# Patient Record
Sex: Female | Born: 1935 | Race: White | Hispanic: No | Marital: Married | Smoking: Never smoker
Health system: Southern US, Community
[De-identification: ages and names within clinical notes are randomized; demographics above are authoritative.]

## PROBLEM LIST (undated history)

## (undated) DIAGNOSIS — I10 Essential (primary) hypertension: Secondary | ICD-10-CM

## (undated) HISTORY — PX: BRAIN SURGERY: SHX531

## (undated) HISTORY — DX: Essential (primary) hypertension: I10

---

## 2007-08-22 ENCOUNTER — Emergency Department: Payer: Self-pay | Admitting: Emergency Medicine

## 2008-03-14 ENCOUNTER — Ambulatory Visit: Payer: Self-pay | Admitting: Otolaryngology

## 2010-08-15 ENCOUNTER — Ambulatory Visit: Payer: Self-pay | Admitting: Internal Medicine

## 2013-11-18 ENCOUNTER — Ambulatory Visit: Payer: Self-pay | Admitting: Family Medicine

## 2017-01-15 NOTE — Progress Notes (Deleted)
01/16/2017 10:07 PM   Whitney Riddle 1932-07-20 [id number]  Referring provider: [name], [place], [place] [address]  No chief complaint on file.   HPI: Patient is a 83 year old *** female who is referred to [place] by, ***, for recurrent urinary tract infections.  Patient states that she has had *** urinary tract infections over the last year.  Reviewing her records,  she has had *** .    Her symptoms with a urinary tract infection consist of ***.  She denies/endorses dysuria, gross hematuria, suprapubic pain, back pain, abdominal pain or flank pain.***  She denies/endorses dysuria, gross hematuria, suprapubic pain, back pain, abdominal pain or flank pain.***  She has not had any recent fevers, chills, nausea or vomiting. ***  She has not had any recent fevers, chills, nausea or vomiting. ***  She does/does not have a history of nephrolithiasis, GU surgery or GU trauma. ***  She does/does not have a history of nephrolithiasis, GU surgery or GU trauma. ***  She is/is not sexually active.  She has/has not noted a correlation with her urinary tract infections and sexual intercourse.  ***   She does/does not engage in anal sex. ***  She is/ is not having anal to vaginal sex.*** She is/is not voiding before and after sex. ***     She is/is not postmenopausal. ***  She admits to/denies constipation and/or diarrhea. ***  She does/does not use tampons.  She does/does not engage in good perineal hygiene. She does/does not take tub baths. ***  She has/does not have incontinence.  She is using incontinence pads. ***  She is having/ not having pain with bladder filling.  ***  She has/not had any recent imaging studies.  ***  She is drinking *** of water daily.     Reviewed referral notes.    PMH: No past medical history on file.  Surgical History: No past surgical history on file.  Home Medications:  Allergies as of 01/16/2017   Not on  File     Medication List    as of 01/15/2017 10:07 PM   You have not been prescribed any medications.     Allergies: Allergies not on file  Family History: No family history on file.  Social History:  has no tobacco, alcohol, and drug history on file.  ROS:                                        Physical Exam: There were no vitals taken for this visit.  Constitutional: Well nourished. Alert and oriented, No acute distress. HEENT: [place] AT, moist mucus membranes. Trachea midline, no masses. Cardiovascular: No clubbing, cyanosis, or edema. Respiratory: Normal respiratory effort, no increased work of breathing. GI: Abdomen is soft, non tender, non distended, no abdominal masses. Liver and spleen not palpable.  No hernias appreciated.  Stool sample for occult testing is not indicated.   GU: No CVA tenderness.  No bladder fullness or masses.  Normal external genitalia, normal pubic hair distribution, no lesions.  Normal urethral meatus, no lesions, no prolapse, no discharge.   No urethral masses, tenderness and/or tenderness. No bladder fullness, tenderness or masses. Normal vagina mucosa, good estrogen effect, no discharge, no lesions, good pelvic support, no cystocele or rectocele noted.  No cervical motion tenderness.  Uterus is freely mobile and non-fixed.  No adnexal/parametria masses or tenderness  noted.  Anus and perineum are without rashes or lesions.   *** Skin: No rashes, bruises or suspicious lesions. Lymph: No cervical or inguinal adenopathy. Neurologic: Grossly intact, no focal deficits, moving all 4 extremities. Psychiatric: Normal mood and affect.  Laboratory Data:  Urinalysis ***  I have reviewed the labs.   Pertinent Imaging: *** I have independently reviewed the films.    Assessment & Plan:  ***   - criteria for recurrent UTI has been met with 2 or more infections in 6 months or 3 or greater infections in one year ***  - Patient is  instructed to increase their water intake until the urine is pale yellow or clear (10 to 12 cups daily) ***  - probiotics (yogurt, oral pills or vaginal suppositories), take cranberry pills or drink the juice and Vitamin C 1,000 mg daily to acidify the urine should be added to their daily regimen ***  -. if using tampons, she should remove them prior to urinating and change them often ***  -avoid soaking in tubs and wipe front to back after urinating ***  - benefit from core strengthening exercises has been seen.  We can refer her to PT if they desire ***  - advised them to have CATH UA's for urinalysis and culture to prevent skin contamination of the specimen  - reviewed symptoms of UTI and advised not to have urine checked or be treated for UTI if not experiencing symptoms  - discussed antibiotic stewardship with the patient                                                   No Follow-up on file.  These notes generated with voice recognition software. I apologize for typographical errors.  Zara Council, [facility] [address] [place], [place] [address] [phone]

## 2017-01-16 ENCOUNTER — Other Ambulatory Visit: Admission: RE | Admit: 2017-01-16 | Payer: Self-pay | Source: Ambulatory Visit

## 2017-01-16 ENCOUNTER — Ambulatory Visit (INDEPENDENT_AMBULATORY_CARE_PROVIDER_SITE_OTHER): Payer: Medicare Other | Admitting: Urology

## 2017-01-16 ENCOUNTER — Encounter: Payer: Self-pay | Admitting: Urology

## 2017-01-16 ENCOUNTER — Ambulatory Visit: Payer: Self-pay | Admitting: Urology

## 2017-01-16 ENCOUNTER — Other Ambulatory Visit
Admission: RE | Admit: 2017-01-16 | Discharge: 2017-01-16 | Disposition: A | Discharge status: Home / Self Care | Payer: Medicare Other | Source: Ambulatory Visit | Attending: Urology | Admitting: Urology

## 2017-01-16 VITALS — BP 136/67 | HR 70 | Ht 63.5 in | Wt 154.5 lb

## 2017-01-16 DIAGNOSIS — N390 Urinary tract infection, site not specified: Secondary | ICD-10-CM

## 2017-01-16 DIAGNOSIS — N952 Postmenopausal atrophic vaginitis: Secondary | ICD-10-CM | POA: Diagnosis not present

## 2017-01-16 LAB — BLADDER SCAN AMB NON-IMAGING: SCAN RESULT: 0

## 2017-01-16 NOTE — Progress Notes (Signed)
01/16/2017 10:19 AM   Whitney Riddle [birth year] [id number]  Referring provider: No referring provider defined for this encounter.  Chief Complaint  Patient presents with  . New Patient (Initial Visit)    Recurrent Uti referred by Dr. Kym Riddle    HPI: Patient is a 83 year old Caucasian female who is referred to [place] by, Dr. Valera Riddle , for recurrent urinary tract infections with her daughter, Whitney Riddle.    Patient states that she has had three urinary tract infections over the last year, but the last two months she has had back to back infections.    Reviewing her records,  she has had three documented positive cultures over the last year.  Her symptoms with a urinary tract infection consist of dysuria.  She denies dysuria, gross hematuria, suprapubic pain, back pain, abdominal pain or flank pain.  She has not had any recent fevers, chills, nausea or vomiting.   She does not have a history of nephrolithiasis, GU surgery or GU trauma.   She is not sexually active.  She is postmenopausal.   She /denies constipation and/or diarrhea. She does engage in good perineal hygiene. She does not take tub baths.   She is not having pain with bladder filling.  She has not had any recent imaging studies.  She is currently experiencing frequency, urgency and nocturia x 1.  PVR is 0 mL.    She is drinking a lot of water daily.  Just started coffee again.  Drinks cranberry juice everyday.  Takes probiotics.      Reviewed referral notes.       PMH: Past Medical History:  Diagnosis Date  . HTN (hypertension)     Surgical History: Past Surgical History:  Procedure Laterality Date  . BRAIN SURGERY     tumor     years ago    Home Medications:  Allergies as of 01/16/2017   Not on File     Medication List       Accurate as of 01/16/17 10:19 AM. Always use your most recent med list.          aspirin 81 MG tablet Take by mouth.   hydrochlorothiazide 25 MG tablet Commonly known  as:  HYDRODIURIL Take by mouth.   lisinopril 5 MG tablet Commonly known as:  PRINIVIL,ZESTRIL Take by mouth.   PROBIOTIC PO Take by mouth.   simvastatin 20 MG tablet Commonly known as:  ZOCOR Take by mouth.   valACYclovir 1000 MG tablet Commonly known as:  VALTREX Take by mouth.   Vitamin D3 1000 units Caps Take by mouth.       Allergies: Not on File  Family History: Family History  Problem Relation Age of Onset  . Kidney cancer Neg Hx   . Bladder Cancer Neg Hx     Social History:  reports that she has never smoked. She has never used smokeless tobacco. She reports that she does not drink alcohol or use drugs.  ROS: UROLOGY Frequent Urination?: Yes Hard to postpone urination?: Yes Burning/pain with urination?: No Get up at night to urinate?: Yes Leakage of urine?: No Urine stream starts and stops?: No Trouble starting stream?: No Do you have to strain to urinate?: No Blood in urine?: No Urinary tract infection?: Yes Sexually transmitted disease?: No Injury to kidneys or bladder?: No Painful intercourse?: No Weak stream?: No Currently pregnant?: No Vaginal bleeding?: No Last menstrual period?: n  Gastrointestinal Nausea?: No Vomiting?: No Indigestion/heartburn?: No Diarrhea?: No Constipation?:  No  Constitutional Fever: No Night sweats?: No Weight loss?: No Fatigue?: No  Skin Skin rash/lesions?: Yes Itching?: No  Eyes Blurred vision?: No Double vision?: No  Ears/Nose/Throat Sore throat?: No Sinus problems?: No  Hematologic/Lymphatic Swollen glands?: No Easy bruising?: No  Cardiovascular Leg swelling?: No Chest pain?: No  Respiratory Cough?: No Shortness of breath?: No  Endocrine Excessive thirst?: No  Musculoskeletal Back pain?: No Joint pain?: No  Neurological Headaches?: No Dizziness?: No  Psychologic Depression?: No Anxiety?: No  Physical Exam: BP 136/67   Pulse 70   Ht 5' 3.5" (1.613 m)   Wt 154 lb 8 oz  (70.1 kg)   BMI 26.94 kg/m   Constitutional: Well nourished. Alert and oriented, No acute distress. HEENT: [place] AT, moist mucus membranes. Trachea midline, no masses. Cardiovascular: No clubbing, cyanosis, or edema. Respiratory: Normal respiratory effort, no increased work of breathing. GI: Abdomen is soft, non tender, non distended, no abdominal masses. Liver and spleen not palpable.  No hernias appreciated.  Stool sample for occult testing is not indicated.   GU: No CVA tenderness.  No bladder fullness or masses.  Atrophic external genitalia, normal pubic hair distribution, no lesions.  Normal urethral meatus, no lesions, no prolapse, no discharge.   No urethral masses, tenderness and/or tenderness. No bladder fullness, tenderness or masses. Pale vagina mucosa, poor estrogen effect, no discharge, no lesions, good pelvic support, no cystocele or rectocele noted.  No cervical motion tenderness.  Uterus is freely mobile and non-fixed.  No adnexal/parametria masses or tenderness noted.  Anus and perineum are without rashes or lesions.    Skin: No rashes, bruises or suspicious lesions. Lymph: No cervical or inguinal adenopathy. Neurologic: Grossly intact, no focal deficits, moving all 4 extremities. Psychiatric: Normal mood and affect.  Laboratory Data: Serum creatinine 0.8 on 10/07/2016  Pertinent Imaging: Results for Whitney, Riddle (MRN [id number]) as of 01/16/2017 09:51  Ref. Range 01/16/2017 09:49  Scan Result Unknown 0   I have independently reviewed the films.    Assessment & Plan:    1. Recurrent UTI's  - criteria for recurrent UTI has been met with 2 or more infections in 6 months or 3 or greater infections in one year   - Patient is instructed to increase their water intake until the urine is pale yellow or clear (10 to 12 cups daily)   - probiotics (yogurt, oral pills or vaginal suppositories), take cranberry pills or drink the juice and Vitamin C 1,000 mg daily to acidify the urine  should be added to their daily regimen   - avoid soaking in tubs and wipe front to back after urinating   - advised them to have CATH UA's for urinalysis and culture to prevent skin contamination of the specimen  - reviewed symptoms of UTI and advised not to have urine checked or be treated for UTI if not experiencing symptoms  - discussed antibiotic stewardship with the patient    - BLADDER SCAN AMB NON-IMAGING  - obtain a RUS to rule out nidus for infection  2. Vaginal atrophy  - discussed vaginal estrogen cream use - patient has sisters with breast cancer and does not want to start the cream  - suggested coconut oil for discomfort  Return for I will call patient with results.  These notes generated with voice recognition software. I apologize for typographical errors.  Zara Council, [facility] [address] [place], [place] [address] [phone] 01/16/2017 10:07 PM

## 2017-01-28 ENCOUNTER — Ambulatory Visit
Admission: RE | Admit: 2017-01-28 | Discharge: 2017-01-28 | Disposition: A | Discharge status: Home / Self Care | Payer: Medicare Other | Source: Ambulatory Visit | Attending: Urology | Admitting: Urology

## 2017-01-28 DIAGNOSIS — Z87440 Personal history of urinary (tract) infections: Secondary | ICD-10-CM | POA: Diagnosis not present

## 2017-01-28 DIAGNOSIS — Z09 Encounter for follow-up examination after completed treatment for conditions other than malignant neoplasm: Secondary | ICD-10-CM | POA: Diagnosis not present

## 2017-01-28 DIAGNOSIS — N390 Urinary tract infection, site not specified: Secondary | ICD-10-CM | POA: Diagnosis present

## 2017-01-30 ENCOUNTER — Telehealth: Payer: Self-pay | Admitting: Family Medicine

## 2017-01-30 ENCOUNTER — Ambulatory Visit: Payer: Medicare Other

## 2017-01-30 NOTE — Telephone Encounter (Signed)
-----   Message from Shannon A McGowan, PA-C sent at 01/28/2017  2:26 PM EDT ----- Please let Mrs. Austill know that her RUS was normal. 

## 2017-01-30 NOTE — Telephone Encounter (Signed)
-----   Message from Harle BattiestShannon A McGowan, PA-C sent at 01/28/2017  2:26 PM EDT ----- Please let Mrs. Whitney Riddle know that her RUS was normal.

## 2017-01-30 NOTE — Telephone Encounter (Signed)
Left message for patient to return call.

## 2017-02-03 NOTE — Telephone Encounter (Signed)
Pt's daughter, Izetta DakinSara Briggs, called and I read message that RUS was fine.  She said mom was still experiencing the same symptoms.  Should she follow up with her MD?

## 2017-02-05 NOTE — Telephone Encounter (Signed)
Spoke with pt daughter, Huntley DecSara, in reference to pt continuing to have dysuria. Huntley DecSara stated she is not sure if pt is cocoanut oil or not. Reinforced with Huntley DecSara pt should use cocoanut oil or consider estrogen creams. Huntley DecSara voiced understanding stating she will f/u with pt.

## 2019-09-12 ENCOUNTER — Other Ambulatory Visit: Payer: Self-pay

## 2019-09-12 ENCOUNTER — Emergency Department: Payer: Medicare Other

## 2019-09-12 ENCOUNTER — Emergency Department
Admission: EM | Admit: 2019-09-12 | Discharge: 2019-09-13 | Disposition: A | Discharge status: Home / Self Care | Payer: Medicare Other | Attending: Student in an Organized Health Care Education/Training Program | Admitting: Student in an Organized Health Care Education/Training Program

## 2019-09-12 ENCOUNTER — Encounter: Payer: Self-pay | Admitting: Emergency Medicine

## 2019-09-12 DIAGNOSIS — M25561 Pain in right knee: Secondary | ICD-10-CM | POA: Diagnosis present

## 2019-09-12 DIAGNOSIS — M7121 Synovial cyst of popliteal space [Baker], right knee: Secondary | ICD-10-CM | POA: Diagnosis not present

## 2019-09-12 DIAGNOSIS — I10 Essential (primary) hypertension: Secondary | ICD-10-CM | POA: Diagnosis not present

## 2019-09-12 DIAGNOSIS — Z79899 Other long term (current) drug therapy: Secondary | ICD-10-CM | POA: Insufficient documentation

## 2019-09-12 DIAGNOSIS — M11261 Other chondrocalcinosis, right knee: Secondary | ICD-10-CM | POA: Diagnosis not present

## 2019-09-12 DIAGNOSIS — Z7982 Long term (current) use of aspirin: Secondary | ICD-10-CM | POA: Insufficient documentation

## 2019-09-12 DIAGNOSIS — M1711 Unilateral primary osteoarthritis, right knee: Secondary | ICD-10-CM | POA: Diagnosis not present

## 2019-09-12 MED ORDER — KETOROLAC TROMETHAMINE 30 MG/ML IJ SOLN
30.0000 mg | Freq: Once | INTRAMUSCULAR | Status: AC
  Administered 2019-09-12: 23:00:00 30 mg via INTRAMUSCULAR
  Filled 2019-09-12: qty 1

## 2019-09-12 MED ORDER — PREDNISONE 10 MG PO TABS: 10.0000 mg | ORAL_TABLET | Freq: Every day | ORAL | 0 refills | Status: DC

## 2019-09-12 MED ORDER — TRAMADOL HCL 50 MG PO TABS: 25.0000 mg | ORAL_TABLET | Freq: Four times a day (QID) | ORAL | 0 refills | Status: AC | PRN

## 2019-09-12 MED ORDER — TRAMADOL HCL 50 MG PO TABS
50.0000 mg | ORAL_TABLET | Freq: Once | ORAL | Status: AC
  Administered 2019-09-12: 23:00:00 50 mg via ORAL
  Filled 2019-09-12: qty 1

## 2019-09-12 NOTE — Discharge Instructions (Addendum)
Please rest ice and elevate the right knee.  Use Ace wrap and knee brace as needed for comfort.  Recommend using walker for ambulation until you can ambulate without a limp.  Take prednisone as prescribed and tramadol as needed for more severe pain.  Call orthopedic office tomorrow to schedule follow-up appointment.  Return to the ER for any increasing pain, swelling, worsening symptoms or urgent changes in your health.

## 2019-09-12 NOTE — ED Provider Notes (Signed)
[facility] EMERGENCY DEPARTMENT Provider Note   CSN: [id number] Arrival date & time: 09/12/19  2052     History Chief Complaint  Patient presents with   Knee Pain    Whitney Riddle is a 86 y.o. female presents to the emergency department for evaluation of right knee pain.  Several days ago her knee buckled gave way and she was having a little bit of knee discomfort.  Earlier today her knee gave way and she fell complaining of increasing knee pain.  No dizziness or lightheadedness, chest pain or shortness of breath.  She has some swelling throughout the knee mostly complains of swelling behind the knee along the popliteal region.  No calf pain or pain or swelling throughout the calf.  No numbness or tingling.  No groin or thigh pain.  She denies any other injury to her body.  She has not had any medications for pain.  HPI     Past Medical History:  Diagnosis Date   HTN (hypertension)     There are no problems to display for this patient.   Past Surgical History:  Procedure Laterality Date   BRAIN SURGERY     tumor     years ago     OB History   No obstetric history on file.     Family History  Problem Relation Age of Onset   Kidney cancer Neg Hx    Bladder Cancer Neg Hx     Social History   Tobacco Use   Smoking status: Never Smoker   Smokeless tobacco: Never Used  Substance Use Topics   Alcohol use: No   Drug use: No    Home Medications Prior to Admission medications   Medication Sig Start Date End Date Taking? Authorizing Provider  aspirin 81 MG tablet Take by mouth. 10/01/10   [provider]  Cholecalciferol (VITAMIN D3) 1000 units CAPS Take by mouth.    [provider]  hydrochlorothiazide (HYDRODIURIL) 25 MG tablet Take by mouth. 10/07/16 09/17/17  [provider]  lisinopril (PRINIVIL,ZESTRIL) 5 MG tablet Take by mouth. 10/07/16 09/17/17  [provider]  predniSONE (DELTASONE) 10 MG tablet  Take 1 tablet (10 mg total) by mouth daily. 6,5,4,3,2,1 six day taper 09/12/19   Evon Slack, PA-C  Probiotic Product (PROBIOTIC PO) Take by mouth.    [provider]  simvastatin (ZOCOR) 20 MG tablet Take by mouth. 10/07/16 09/17/17  [provider]  traMADol (ULTRAM) 50 MG tablet Take 0.5-1 tablets (25-50 mg total) by mouth every 6 (six) hours as needed. 09/12/19 09/11/20  Evon Slack, PA-C  valACYclovir (VALTREX) 1000 MG tablet Take by mouth. 10/07/16   [provider]    Allergies    Patient has no known allergies.  Review of Systems   Review of Systems  Respiratory: Negative for shortness of breath.   Gastrointestinal: Negative for nausea and vomiting.  Musculoskeletal: Positive for arthralgias, gait problem and joint swelling. Negative for myalgias and neck pain.  Skin: Negative for rash and wound.  Neurological: Negative for dizziness, numbness and headaches.    Physical Exam Updated Vital Signs BP (!) 172/78 (BP Location: Right Arm)    Pulse 61    Temp 98.4 F (36.9 C) (Oral)    Resp 20    Ht 5\' 3"  (1.6 m)    Wt 70.3 kg    SpO2 100%    BMI 27.46 kg/m   Physical Exam Constitutional:  Appearance: She is well-developed.  HENT:     Head: Normocephalic and atraumatic.  Eyes:     Conjunctiva/sclera: Conjunctivae normal.  Cardiovascular:     Rate and Rhythm: Normal rate.  Pulmonary:     Effort: Pulmonary effort is normal. No respiratory distress.  Musculoskeletal:     Cervical back: Normal range of motion.     Comments: Examination of the right lower extremity shows negative logrolling test.  She has pain with flexion past 90 degrees reproducing pain along the Baker's cyst.  She has a palpable fluctuant Baker's cyst.  No warmth or erythema throughout the knee.  No noticeable knee effusion.  Extensor mechanism is intact, able to actively straight leg raise.  Patella tracks well.  No calf tenderness.  Ankle plantarflexion dorsiflexion is intact.   No warmth redness or edema throughout the lower extremity.  No ligamentous laxity with valgus or varus stress testing.  She does have some tenderness along the medial and lateral joint line of the right knee.  Skin:    General: Skin is warm.     Findings: No rash.  Neurological:     Mental Status: She is alert and oriented to person, place, and time.  Psychiatric:        Behavior: Behavior normal.        Thought Content: Thought content normal.     ED Results / Procedures / Treatments   Labs (all labs ordered are listed, but only abnormal results are displayed) Labs Reviewed - No data to display  EKG None  Radiology DG Knee Complete 4 Views Right  Result Date: 09/12/2019 CLINICAL DATA:  Right knee pain and swelling, fell EXAM: RIGHT KNEE - COMPLETE 4+ VIEW COMPARISON:  None. FINDINGS: Frontal, bilateral oblique, and lateral views of the right knee are obtained. There is mild 3 compartmental osteoarthritis with joint space narrowing and osteophyte formation most pronounced in the medial and patellofemoral compartments. There is diffuse chondrocalcinosis. No fracture, subluxation, or dislocation. No joint effusion. IMPRESSION: 1. Three compartmental osteoarthritis. 2. No acute bony abnormality. Electronically Signed   By: Sharlet Salina M.D.   On: 09/12/2019 22:13    Procedures .Splint Application  Date/Time: 09/12/2019 11:15 PM Performed by: Evon Slack, PA-C Authorized by: Evon Slack, PA-C   Consent:    Consent obtained:  Verbal   Consent given by:  Patient Pre-procedure details:    Sensation:  Normal Procedure details:    Laterality:  Right   Location:  Knee   Knee:  R knee   Strapping: no     Supplies:  Prefabricated splint (Short Velcro knee stabilizing brace.  Ace wrap applied underneath for compression) Post-procedure details:    Pain:  Improved   Sensation:  Normal   Patient tolerance of procedure:  Tolerated well, no immediate complications    (including critical care time)  Medications Ordered in ED Medications  ketorolac (TORADOL) 30 MG/ML injection 30 mg (30 mg Intramuscular Given 09/12/19 2248)  traMADol (ULTRAM) tablet 50 mg (50 mg Oral Given 09/12/19 2247)    ED Course  I have reviewed the triage vital signs and the nursing notes.  Pertinent labs & imaging results that were available during my care of the patient were reviewed by me and considered in my medical decision making (see chart for details).    MDM Rules/Calculators/A&P                      86 year old female with right knee  buckling and giving away on her.  X-ray show mild tricompartmental osteoarthritis with chondrocalcinosis.  On exam she is noted to have a Baker's cyst.  No tendon deficits or ligamentous laxity.  No sign of blood clot or hip fracture.  She is quite tender along the Baker's cyst.  She is educated on rest ice and elevation as well as compression.  She is placed into Ace wrap as well as the knee brace.  She will use a walker for ambulation. Final Clinical Impression(s) / ED Diagnoses Final diagnoses:  Primary osteoarthritis of right knee  Synovial cyst of right popliteal space  Chondrocalcinosis of right knee  Acute pain of right knee    Rx / DC Orders ED Discharge Orders         Ordered    traMADol (ULTRAM) 50 MG tablet  Every 6 hours PRN     09/12/19 2311    predniSONE (DELTASONE) 10 MG tablet  Daily     09/12/19 2311           Renata Caprice 09/12/19 2323    Merlyn Lot, MD 09/12/19 2325

## 2019-09-12 NOTE — ED Notes (Signed)
Pt c/o mechanical fall earlier today. Pt c/o R knee pain, worse with movement. Pt with noted swelling to R knee upon assessment. Pt is currently straighten R knee at this time.

## 2019-09-12 NOTE — ED Triage Notes (Signed)
Pt to triage via w/c, mask in place with no distress noted; pt reports PTA slipped and fell on rt knee; c/o persistent pain

## 2021-06-13 ENCOUNTER — Encounter: Payer: Self-pay | Admitting: Unknown Physician Specialty

## 2021-06-14 NOTE — Discharge Instructions (Signed)
[facility] DISCHARGE INSTRUCTIONS FOR MYRINGOTOMY AND TUBE INSERTION  [facility] Davina Poke, M.D.   Diet:   After surgery, the patient should take only liquids and foods as tolerated.  The patient may then have a regular diet after the effects of anesthesia have worn off, usually about four to six hours after surgery.  Activities:   The patient should rest until the effects of anesthesia have worn off.  After this, there are no restrictions on the normal daily activities.  Medications:   You will be given antibiotic drops to be used in the ears postoperatively.  It is recommended to use 4 drops 2 times a day for 4 days, then the drops should be saved for possible future use.  The tubes should not cause any discomfort to the patient, but if there is any question, Tylenol should be given according to the instructions for the age of the patient.  Other medications should be continued normally.  Precautions:   Should there be recurrent drainage after the tubes are placed, the drops should be used for approximately 3-4 days.  If it does not clear, you should call the ENT office.  Earplugs:   Earplugs are only needed for those who are going to be submerged under water.  When taking a bath or shower and using a cup or showerhead to rinse hair, it is not necessary to wear earplugs.  These come in a variety of fashions, all of which can be obtained at our office.  However, if one is not able to come by the office, then silicone plugs can be found at most pharmacies.  It is not advised to stick anything in the ear that is not approved as an earplug.  Silly putty is not to be used as an earplug.  Swimming is allowed in patients after ear tubes are inserted, however, they must wear earplugs if they are going to be submerged under water.  For those children who are going to be swimming a lot, it is recommended to use a fitted ear mold, which can be made by our  audiologist.  If discharge is noticed from the ears, this most likely represents an ear infection.  We would recommend getting your eardrops and using them as indicated above.  If it does not clear, then you should call the ENT office.  For follow up, the patient should return to the ENT office three weeks postoperatively and then every six months as required by the doctor. [facility] DISCHARGE INSTRUCTIONS FOR MYRINGOTOMY AND TUBE INSERTION  [facility] Davina Poke, M.D.   Diet:   After surgery, the patient should take only liquids and foods as tolerated.  The patient may then have a regular diet after the effects of anesthesia have worn off, usually about four to six hours after surgery.  Activities:   The patient should rest until the effects of anesthesia have worn off.  After this, there are no restrictions on the normal daily activities.  Medications:   You will be given antibiotic drops to be used in the ears postoperatively.  It is recommended to use 4 drops 2 times a day for 4 days, then the drops should be saved for possible future use.  The tubes should not cause any discomfort to the patient, but if there is any question, Tylenol should be given according to the instructions for the age of the patient.  Other  medications should be continued normally.  Precautions:   Should there be recurrent drainage after the tubes are placed, the drops should be used for approximately 3-4 days.  If it does not clear, you should call the ENT office.  Earplugs:   Earplugs are only needed for those who are going to be submerged under water.  When taking a bath or shower and using a cup or showerhead to rinse hair, it is not necessary to wear earplugs.  These come in a variety of fashions, all of which can be obtained at our office.  However, if one is not able to come by the office, then silicone plugs can be found at most pharmacies.  It is not advised to stick  anything in the ear that is not approved as an earplug.  Silly putty is not to be used as an earplug.  Swimming is allowed in patients after ear tubes are inserted, however, they must wear earplugs if they are going to be submerged under water.  For those children who are going to be swimming a lot, it is recommended to use a fitted ear mold, which can be made by our audiologist.  If discharge is noticed from the ears, this most likely represents an ear infection.  We would recommend getting your eardrops and using them as indicated above.  If it does not clear, then you should call the ENT office.  For follow up, the patient should return to the ENT office three weeks postoperatively and then every six months as required by the doctor.

## 2021-06-21 ENCOUNTER — Ambulatory Visit: Payer: Medicare Other | Admitting: Anesthesiology

## 2021-06-21 ENCOUNTER — Encounter: Payer: Self-pay | Admitting: Unknown Physician Specialty

## 2021-06-21 ENCOUNTER — Encounter
Admission: RE | Disposition: A | Discharge status: Home / Self Care | Payer: Self-pay | Source: Home / Self Care | Attending: Unknown Physician Specialty

## 2021-06-21 ENCOUNTER — Other Ambulatory Visit: Payer: Self-pay

## 2021-06-21 ENCOUNTER — Ambulatory Visit
Admission: RE | Admit: 2021-06-21 | Discharge: 2021-06-21 | Disposition: A | Discharge status: Home / Self Care | Payer: Medicare Other | Attending: Unknown Physician Specialty | Admitting: Unknown Physician Specialty

## 2021-06-21 DIAGNOSIS — H6532 Chronic mucoid otitis media, left ear: Secondary | ICD-10-CM | POA: Insufficient documentation

## 2021-06-21 DIAGNOSIS — H6992 Unspecified Eustachian tube disorder, left ear: Secondary | ICD-10-CM | POA: Diagnosis present

## 2021-06-21 HISTORY — PX: MYRINGOTOMY WITH TUBE PLACEMENT: SHX5663

## 2021-06-21 SURGERY — MYRINGOTOMY WITH TUBE PLACEMENT
Anesthesia: General | Site: Ear | Laterality: Left

## 2021-06-21 MED ORDER — GLYCOPYRROLATE 0.2 MG/ML IJ SOLN
INTRAMUSCULAR | Status: DC | PRN
  Administered 2021-06-21: .1 mg via INTRAVENOUS

## 2021-06-21 MED ORDER — LACTATED RINGERS IV SOLN: INTRAVENOUS | Status: DC

## 2021-06-21 MED ORDER — ONDANSETRON HCL 4 MG/2ML IJ SOLN
INTRAMUSCULAR | Status: DC | PRN
  Administered 2021-06-21: 4 mg via INTRAVENOUS

## 2021-06-21 MED ORDER — LIDOCAINE HCL (CARDIAC) PF 100 MG/5ML IV SOSY
PREFILLED_SYRINGE | INTRAVENOUS | Status: DC | PRN
  Administered 2021-06-21: 20 mg via INTRAVENOUS

## 2021-06-21 MED ORDER — PROPOFOL 10 MG/ML IV BOLUS
INTRAVENOUS | Status: DC | PRN
  Administered 2021-06-21: 70 mg via INTRAVENOUS

## 2021-06-21 MED ORDER — CIPROFLOXACIN-DEXAMETHASONE 0.3-0.1 % OT SUSP
OTIC | Status: DC | PRN
  Administered 2021-06-21: 4 [drp] via OTIC

## 2021-06-21 SURGICAL SUPPLY — 9 items
BALL CTTN LRG ABS STRL LF (GAUZE/BANDAGES/DRESSINGS) ×1
BLADE MYR LANCE NRW W/HDL (BLADE) ×2 IMPLANT
CANISTER SUCT 1200ML W/VALVE (MISCELLANEOUS) ×2 IMPLANT
COTTONBALL LRG STERILE PKG (GAUZE/BANDAGES/DRESSINGS) ×2 IMPLANT
GLOVE SURG ENC TEXT LTX SZ7.5 (GLOVE) ×2 IMPLANT
TOWEL OR 17X26 4PK STRL BLUE (TOWEL DISPOSABLE) ×2 IMPLANT
TUBE EAR T 1.27X5.3 BFLY (OTOLOGIC RELATED) ×1 IMPLANT
TUBING CONN 6MMX3.1M (TUBING) ×1
TUBING SUCTION CONN 0.25 STRL (TUBING) ×1 IMPLANT

## 2021-06-21 NOTE — H&P (Signed)
The patient's history has been reviewed, patient examined, no change in status, stable for surgery.  Questions were answered to the patients satisfaction.  

## 2021-06-21 NOTE — Anesthesia Procedure Notes (Signed)
Procedure Name: General with mask airway Date/Time: 06/21/2021 8:20 AM Performed by: Mayme Genta, CRNA Pre-anesthesia Checklist: Patient identified, Patient being monitored, Emergency Drugs available, Timeout performed and Suction available Patient Re-evaluated:Patient Re-evaluated prior to induction Oxygen Delivery Method: Circle system utilized Preoxygenation: Pre-oxygenation with 100% oxygen Induction Type: Combination inhalational/ intravenous induction Ventilation: Mask ventilation without difficulty Dental Injury: Teeth and Oropharynx as per pre-operative assessment

## 2021-06-21 NOTE — Anesthesia Postprocedure Evaluation (Signed)
Anesthesia Post Note  Patient: Whitney Riddle  Procedure(s) Performed: MYRINGOTOMY WITH BUTTERLY TUBE PLACEMENT (Left: Ear)     Patient location during evaluation: PACU Anesthesia Type: General Level of consciousness: awake and alert and oriented Pain management: satisfactory to patient Vital Signs Assessment: post-procedure vital signs reviewed and stable Respiratory status: spontaneous breathing, nonlabored ventilation and respiratory function stable Cardiovascular status: blood pressure returned to baseline and stable Postop Assessment: Adequate PO intake and No signs of nausea or vomiting Anesthetic complications: no   No notable events documented.  Raliegh Ip

## 2021-06-21 NOTE — Op Note (Signed)
06/21/2021  8:31 AM    Whitney Riddle  [id number]   Pre-Op Dx: Whitney Riddle tube dysfunction  Post-op Dx: SAME  Proc: Left myringotomy with butterfly tube placement  Surg:  Davina Poke  Anes:  GOT  EBL: 0  Comp: None  Findings: Left serous otitis  Procedure: Whitney Riddle was identified in the holding area take the operating placed in supine position.  After general mask anesthesia the operating microscope was brought on the field.  Examination of the ear canal showed serous fluid in the middle ear space.  An inferior myringotomy was performed and serous fluid was suctioned free.  A butterfly tube was then placed within the myringotomy followed by Ciprodex.  Patient was then returned to anesthesia where she was awakened in the operating room and taken to the recovery room in stable condition.  Dispo:   Good  Plan: Discharged home follow-up 3-week  Davina Poke  06/21/2021 8:31 AM

## 2021-06-21 NOTE — Transfer of Care (Signed)
Immediate Anesthesia Transfer of Care Note  Patient: Whitney Riddle  Procedure(s) Performed: MYRINGOTOMY WITH BUTTERLY TUBE PLACEMENT (Left: Ear)  Patient Location: PACU  Anesthesia Type: General  Level of Consciousness: awake, alert  and patient cooperative  Airway and Oxygen Therapy: Patient Spontanous Breathing and Patient connected to supplemental oxygen  Post-op Assessment: Post-op Vital signs reviewed, Patient's Cardiovascular Status Stable, Respiratory Function Stable, Patent Airway and No signs of Nausea or vomiting  Post-op Vital Signs: Reviewed and stable  Complications: No notable events documented.

## 2021-06-21 NOTE — Anesthesia Preprocedure Evaluation (Signed)
Anesthesia Evaluation  Patient identified by MRN, date of birth, ID band Patient awake    Reviewed: Allergy & Precautions, H&P , NPO status , Patient's Chart, lab work & pertinent test results  Airway Mallampati: II  TM Distance: >3 FB Neck ROM: full    Dental no notable dental hx.    Pulmonary    Pulmonary exam normal breath sounds clear to auscultation       Cardiovascular hypertension, Normal cardiovascular exam Rhythm:regular Rate:Normal     Neuro/Psych    GI/Hepatic   Endo/Other    Renal/GU      Musculoskeletal   Abdominal   Peds  Hematology   Anesthesia Other Findings   Reproductive/Obstetrics                             Anesthesia Physical Anesthesia Plan  ASA: 2  Anesthesia Plan: General   Post-op Pain Management: Minimal or no pain anticipated   Induction:   PONV Risk Score and Plan: 3 and Treatment may vary due to age or medical condition, Ondansetron and Dexamethasone  Airway Management Planned:   Additional Equipment:   Intra-op Plan:   Post-operative Plan:   Informed Consent: I have reviewed the patients History and Physical, chart, labs and discussed the procedure including the risks, benefits and alternatives for the proposed anesthesia with the patient or authorized representative who has indicated his/her understanding and acceptance.     Dental Advisory Given  Plan Discussed with: CRNA  Anesthesia Plan Comments:         Anesthesia Quick Evaluation

## 2021-06-24 ENCOUNTER — Encounter: Payer: Self-pay | Admitting: Unknown Physician Specialty

## 2021-06-30 IMAGING — DX DG KNEE COMPLETE 4+V*R*
4 series · 4 of 4 positions shown · non-contrast
Comparison: None.

CLINICAL DATA: Right knee pain and swelling, fell

EXAM:
RIGHT KNEE - COMPLETE 4+ VIEW

[knee ap]
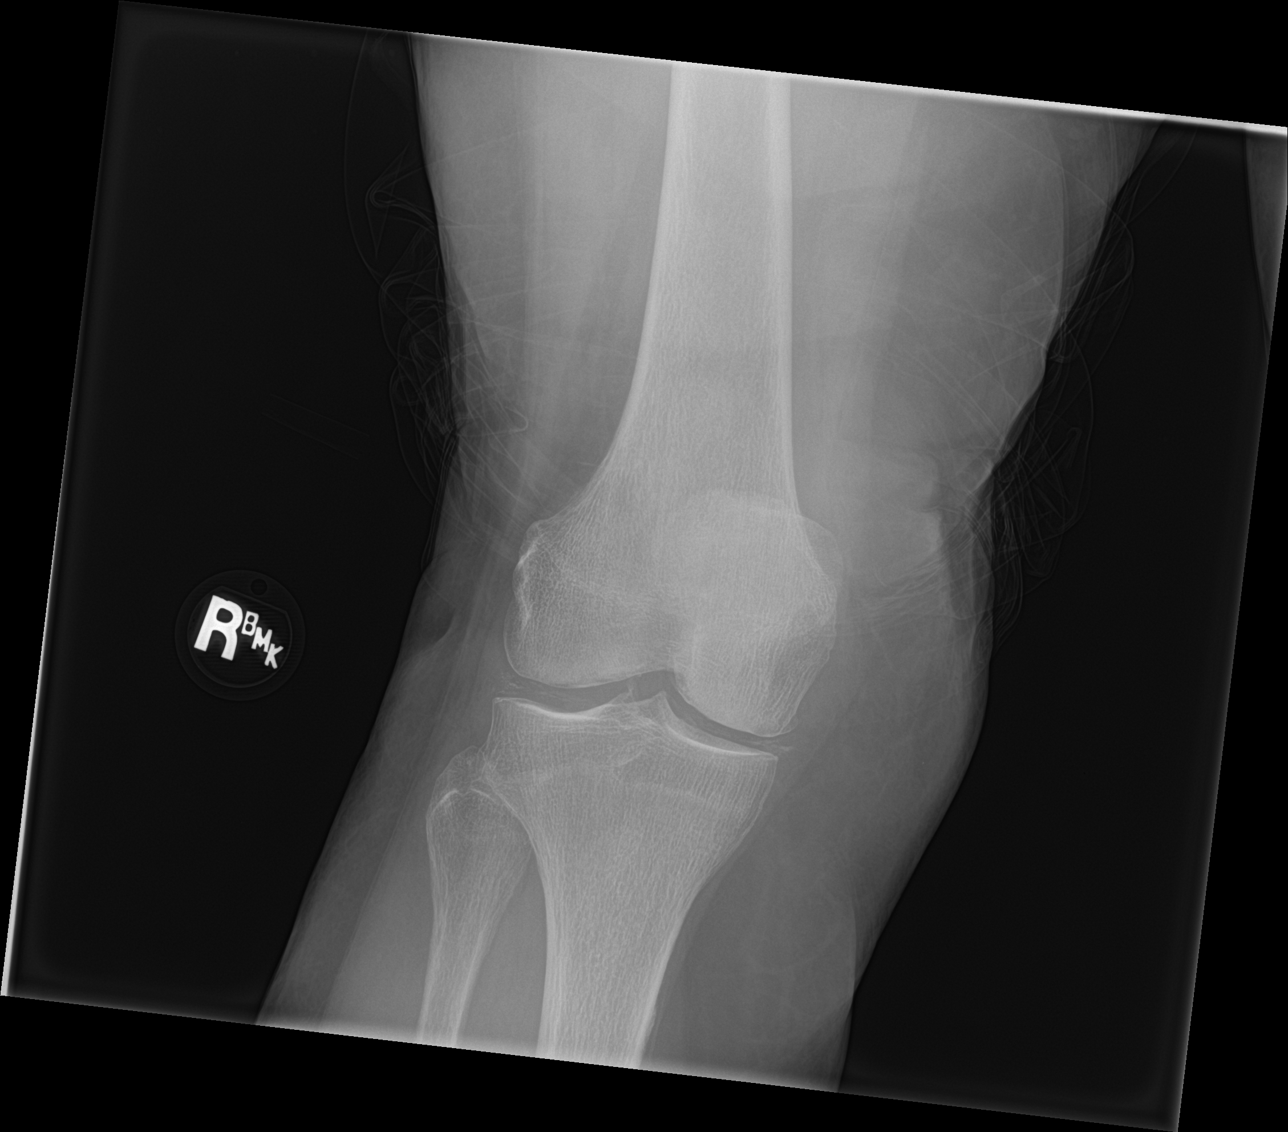

[knee tunnel]
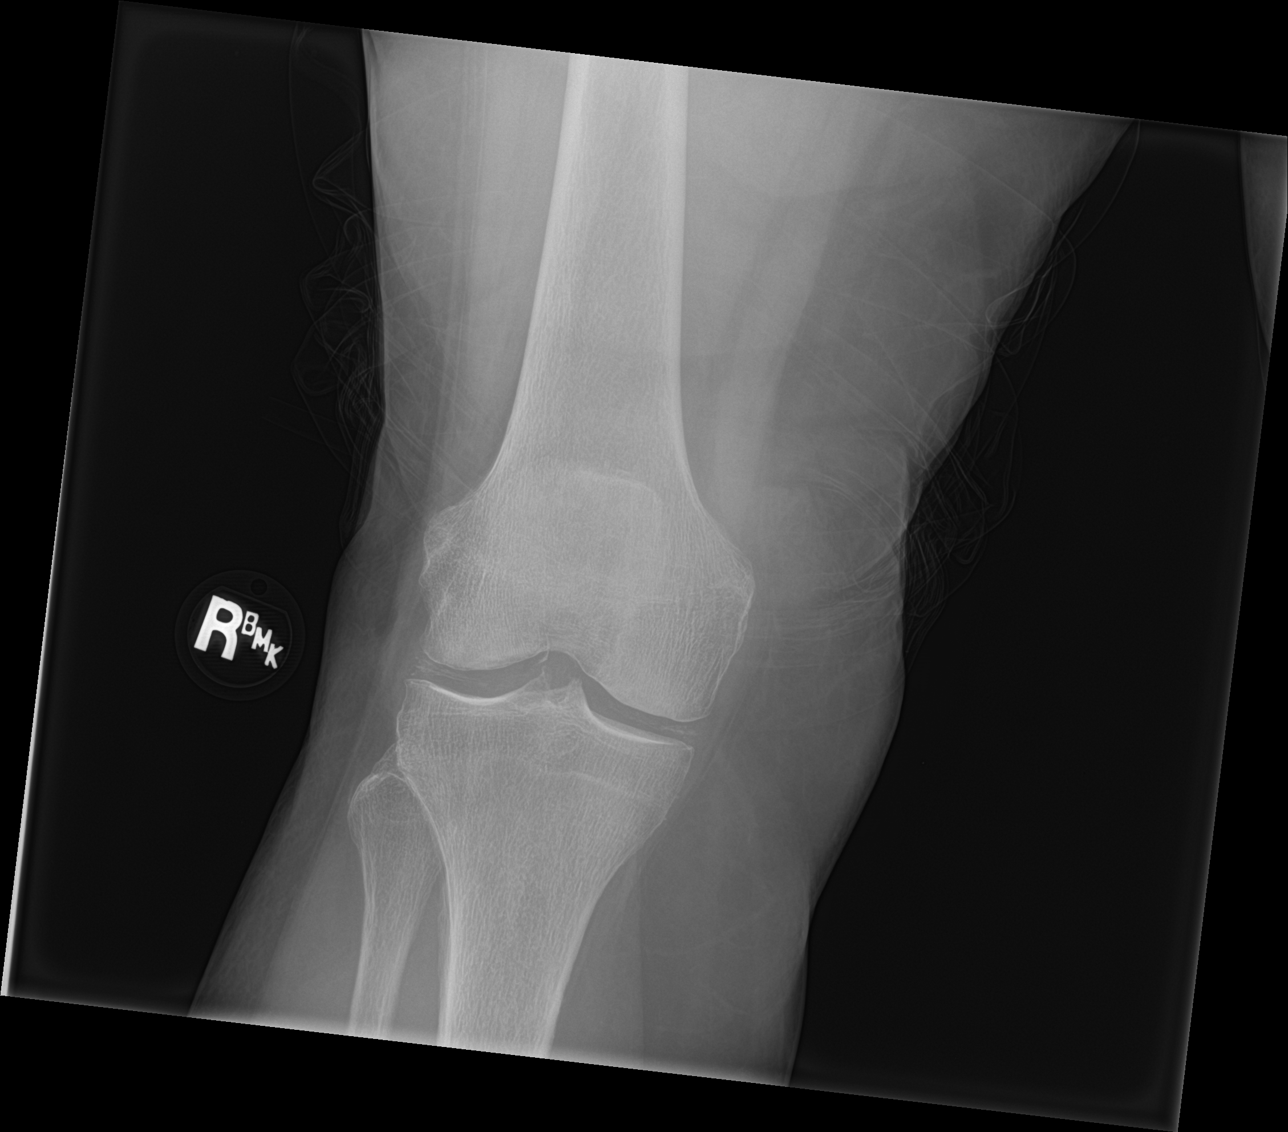

[knee lat]
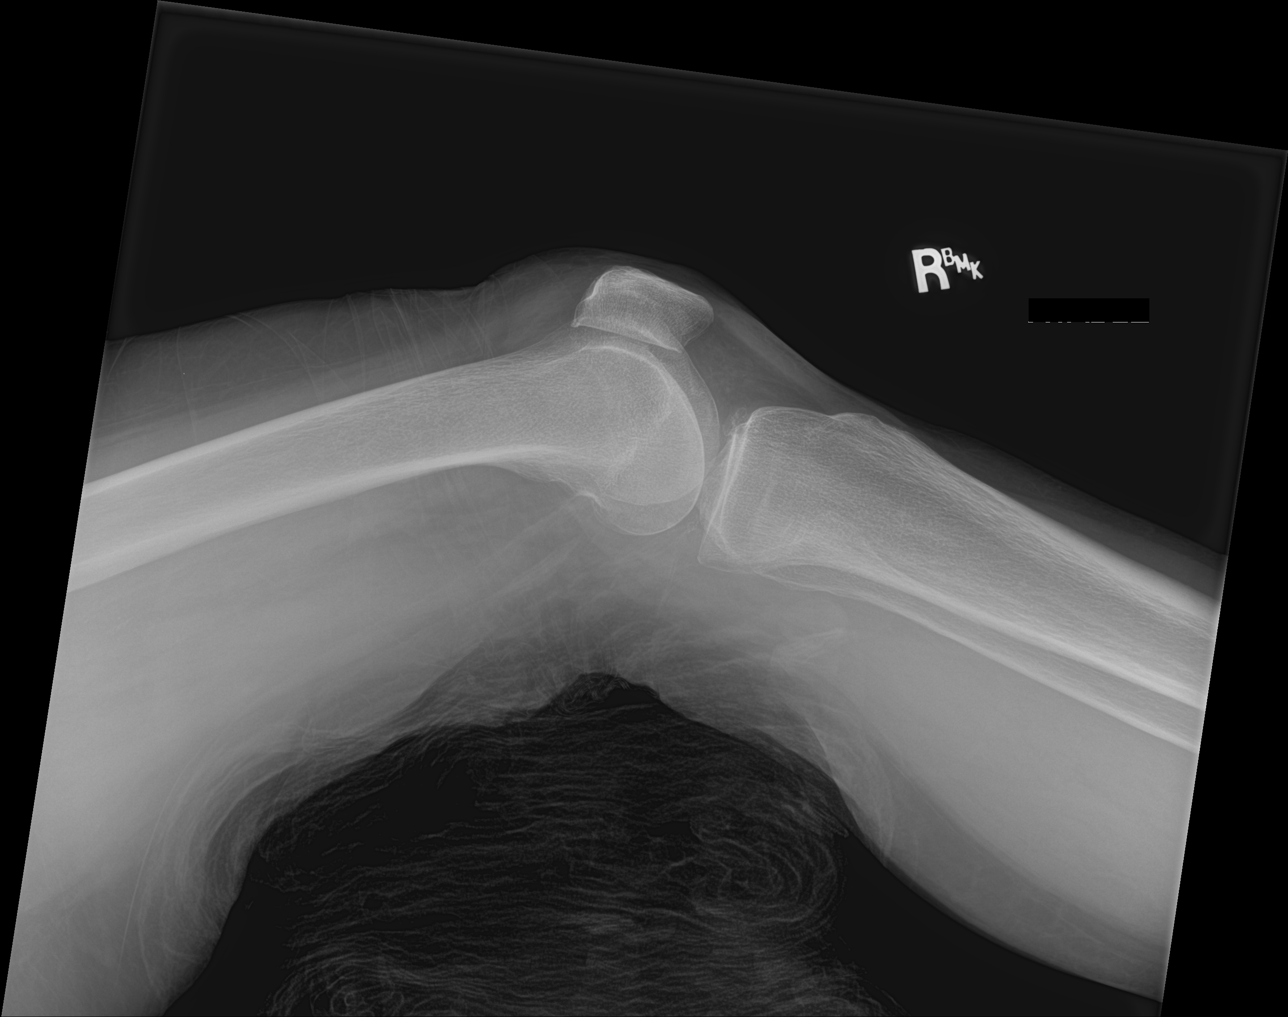

[patella skyline]
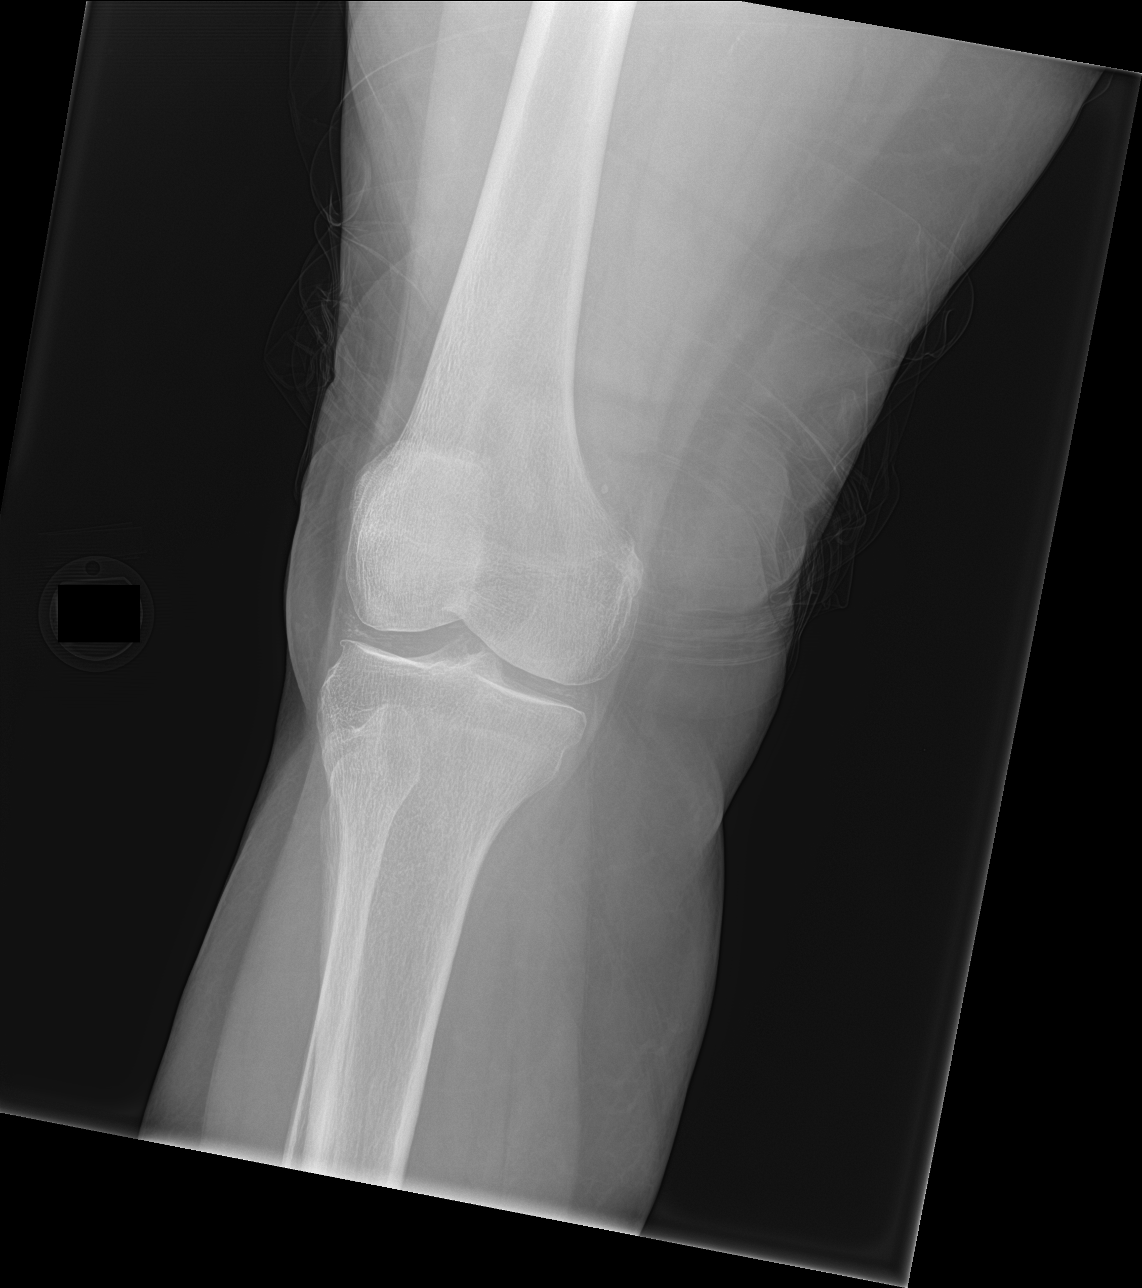

[4 of 4 positions shown; findings below may reference images not displayed]

FINDINGS: Frontal, bilateral oblique, and lateral views of the right knee are
obtained. There is mild 3 compartmental osteoarthritis with joint
space narrowing and osteophyte formation most pronounced in the
medial and patellofemoral compartments. There is diffuse
chondrocalcinosis. No fracture, subluxation, or dislocation. No
joint effusion.
IMPRESSION: 1. Three compartmental osteoarthritis.
2. No acute bony abnormality.

## 2023-02-21 ENCOUNTER — Emergency Department: Payer: Medicare Other

## 2023-02-21 ENCOUNTER — Other Ambulatory Visit: Payer: Self-pay

## 2023-02-21 ENCOUNTER — Emergency Department
Admission: EM | Admit: 2023-02-21 | Discharge: 2023-02-21 | Disposition: A | Discharge status: Home / Self Care | Payer: Medicare Other | Attending: Emergency Medicine | Admitting: Emergency Medicine

## 2023-02-21 DIAGNOSIS — F0390 Unspecified dementia without behavioral disturbance: Secondary | ICD-10-CM | POA: Insufficient documentation

## 2023-02-21 DIAGNOSIS — E876 Hypokalemia: Secondary | ICD-10-CM | POA: Diagnosis not present

## 2023-02-21 DIAGNOSIS — E871 Hypo-osmolality and hyponatremia: Secondary | ICD-10-CM | POA: Diagnosis not present

## 2023-02-21 DIAGNOSIS — R410 Disorientation, unspecified: Secondary | ICD-10-CM | POA: Insufficient documentation

## 2023-02-21 DIAGNOSIS — R630 Anorexia: Secondary | ICD-10-CM | POA: Insufficient documentation

## 2023-02-21 DIAGNOSIS — E785 Hyperlipidemia, unspecified: Secondary | ICD-10-CM | POA: Insufficient documentation

## 2023-02-21 DIAGNOSIS — I10 Essential (primary) hypertension: Secondary | ICD-10-CM | POA: Insufficient documentation

## 2023-02-21 DIAGNOSIS — E860 Dehydration: Secondary | ICD-10-CM | POA: Diagnosis not present

## 2023-02-21 LAB — CBC WITH DIFFERENTIAL/PLATELET
Abs Immature Granulocytes: 0.02 10*3/uL (ref 0.00–0.07)
Basophils Absolute: 0 10*3/uL (ref 0.0–0.1)
Basophils Relative: 1 %
Eosinophils Absolute: 0.1 10*3/uL (ref 0.0–0.5)
Eosinophils Relative: 1 %
HCT: 38.4 % (ref 36.0–46.0)
Hemoglobin: 13.1 g/dL (ref 12.0–15.0)
Immature Granulocytes: 0 %
Lymphocytes Relative: 29 %
Lymphs Abs: 1.9 10*3/uL (ref 0.7–4.0)
MCH: 32 pg (ref 26.0–34.0)
MCHC: 34.1 g/dL (ref 30.0–36.0)
MCV: 93.9 fL (ref 80.0–100.0)
Monocytes Absolute: 0.5 10*3/uL (ref 0.1–1.0)
Monocytes Relative: 9 %
Neutro Abs: 3.9 10*3/uL (ref 1.7–7.7)
Neutrophils Relative %: 60 %
Platelets: 239 10*3/uL (ref 150–400)
RBC: 4.09 MIL/uL (ref 3.87–5.11)
RDW: 13.9 % (ref 11.5–15.5)
WBC: 6.4 10*3/uL (ref 4.0–10.5)
nRBC: 0 % (ref 0.0–0.2)

## 2023-02-21 LAB — COMPREHENSIVE METABOLIC PANEL
ALT: 18 U/L (ref 0–44)
AST: 22 U/L (ref 15–41)
Albumin: 4.2 g/dL (ref 3.5–5.0)
Alkaline Phosphatase: 69 U/L (ref 38–126)
Anion gap: 12 (ref 5–15)
BUN: 14 mg/dL (ref 8–23)
CO2: 25 mmol/L (ref 22–32)
Calcium: 9.2 mg/dL (ref 8.9–10.3)
Chloride: 95 mmol/L — ABNORMAL LOW (ref 98–111)
Creatinine, Ser: 0.48 mg/dL (ref 0.44–1.00)
GFR, Estimated: >60 mL/min (ref >60)
Glucose, Bld: 150 mg/dL — ABNORMAL HIGH (ref 70–99)
Potassium: 3.1 mmol/L — ABNORMAL LOW (ref 3.5–5.1)
Sodium: 132 mmol/L — ABNORMAL LOW (ref 135–145)
Total Bilirubin: 1 mg/dL (ref 0.3–1.2)
Total Protein: 6.7 g/dL (ref 6.5–8.1)

## 2023-02-21 LAB — URINALYSIS, ROUTINE W REFLEX MICROSCOPIC
Bacteria, UA: NONE SEEN
Bilirubin Urine: NEGATIVE
Glucose, UA: NEGATIVE mg/dL
Hgb urine dipstick: NEGATIVE
Ketones, ur: NEGATIVE mg/dL
Nitrite: NEGATIVE
Protein, ur: NEGATIVE mg/dL
Specific Gravity, Urine: 1.009 (ref 1.005–1.030)
pH: 7 (ref 5.0–8.0)

## 2023-02-21 MED ORDER — POTASSIUM CHLORIDE CRYS ER 20 MEQ PO TBCR
40.0000 meq | EXTENDED_RELEASE_TABLET | Freq: Once | ORAL | Status: AC
  Administered 2023-02-21: 40 meq via ORAL
  Filled 2023-02-21: qty 2

## 2023-02-21 MED ORDER — LACTATED RINGERS IV BOLUS
1000.0000 mL | Freq: Once | INTRAVENOUS | Status: AC
  Administered 2023-02-21: 1000 mL via INTRAVENOUS

## 2023-02-21 NOTE — ED Triage Notes (Signed)
Pt to ED accompanied with son c/o increased confusion x 1 week, progressively getting worse. HX brain tumor. Pt reports "feeling tired" But voicing no other complaints. Pt communicating appropriately in triage.

## 2023-02-21 NOTE — Discharge Instructions (Signed)
You were seen in the emergency department today for confusion symptoms.  CT imaging of the head and neck are reassuring at this time.  Laboratory workup notable for slightly low potassium and sodium but otherwise unremarkable.  Do suspect some your symptoms may be due to diminished fluid intake and dehydration today.  Please follow-up with your primary care provider for reassessment later this week.

## 2023-02-21 NOTE — ED Provider Notes (Signed)
[facility] Provider Note    Event Date/Time   First MD Initiated Contact with Patient 02/21/23 1537     (approximate)   History   Altered Mental Status   HPI Whitney Riddle is a 89 y.o. female with HTN, HLD, history of meningioma presenting today for confusion x 1 week.  Patient presents from her independent living facility with her son and daughter.  They have noted intermittently over the past week she has had some confusion.  It is not consistent but slightly worse than her normal baseline early dementia.  Patient at this time has no complaints and feels totally at her baseline.  She is oriented to person, place, and situation but not to year which is typical for her.  She reportedly did have a fall earlier this morning where she hit the side of her head and neck but no loss of consciousness.  Not on any blood thinners.  Denies any dysuria symptoms.  Does note decreased p.o. intake over the past week.     Physical Exam   Triage Vital Signs: ED Triage Vitals [02/21/23 1449]  Encounter Vitals Group     BP (!) 144/60     Systolic BP Percentile      Diastolic BP Percentile      Pulse Rate 65     Resp 16     Temp 98 F (36.7 C)     Temp Source Oral     SpO2 95 %     Weight 147 lb (66.7 kg)     Height 5\' 3"  (1.6 m)     Head Circumference      Peak Flow      Pain Score      Pain Loc      Pain Education      Exclude from Growth Chart     Most recent vital signs: Vitals:   02/21/23 1449  BP: (!) 144/60  Pulse: 65  Resp: 16  Temp: 98 F (36.7 C)  SpO2: 95%   Physical Exam: I have reviewed the vital signs and nursing notes. General: Awake, alert, no acute distress.  Nontoxic appearing. Head:  Atraumatic, normocephalic.   ENT:  EOM intact, PERRL. Oral mucosa is pink and moist with no lesions. Neck: Neck is supple with full range of motion, No meningeal signs. Cardiovascular:  RRR, No murmurs. Peripheral pulses palpable and equal  bilaterally. Respiratory:  Symmetrical chest wall expansion.  No rhonchi, rales, or wheezes.  Good air movement throughout.  No use of accessory muscles.   Musculoskeletal:  No cyanosis or edema. Moving extremities with full ROM Abdomen:  Soft, nontender, nondistended. Neuro:  GCS 15, moving all four extremities, interacting appropriately. Speech clear.  Oriented to person, place, and situation but not to year.  This is patient's baseline. Psych:  Calm, appropriate.   Skin:  Warm, dry, no rash.    ED Results / Procedures / Treatments   Labs (all labs ordered are listed, but only abnormal results are displayed) Labs Reviewed  COMPREHENSIVE METABOLIC PANEL - Abnormal; Notable for the following components:      Result Value   Sodium 132 (*)    Potassium 3.1 (*)    Chloride 95 (*)    Glucose, Bld 150 (*)    All other components within normal limits  URINALYSIS, ROUTINE W REFLEX MICROSCOPIC - Abnormal; Notable for the following components:   Color, Urine YELLOW (*)    APPearance CLEAR (*)  Leukocytes,Ua SMALL (*)    All other components within normal limits  CBC WITH DIFFERENTIAL/PLATELET     EKG My EKG interpretation: Rate of 61, normal sinus rhythm, left axis deviation, normal intervals.  No acute ST elevation or depression.   RADIOLOGY Independently interpreted CT head and C-spine with no acute pathology   PROCEDURES:  Critical Care performed: No  Procedures   MEDICATIONS ORDERED IN ED: Medications  lactated ringers bolus 1,000 mL (1,000 mLs Intravenous New Bag/Given 02/21/23 1653)  potassium chloride SA (KLOR-CON M) CR tablet 40 mEq (40 mEq Oral Given 02/21/23 1642)     IMPRESSION / MDM / ASSESSMENT AND PLAN / ED COURSE  I reviewed the triage vital signs and the nursing notes.                              Differential diagnosis includes, but is not limited to, dehydration, UTI, worsening of chronic meningioma, ICH.  Patient's presentation is most consistent  with acute complicated illness / injury requiring diagnostic workup.  Patient is an 89 year old female with mild early dementia presenting today for intermittent confusion over the past week.  Vital signs stable on arrival and mental status overall seems consistent with her baseline per patient and family in the room.  Laboratory workup was performed and shows mild hyponatremia and hypokalemia but otherwise unremarkable.  UA with no evidence of a UTI to suspect infection as a source of her change in mental status.  CT head and C-spine showed no acute pathology given fall with head injury earlier today.  Patient was able to ambulate around the room without difficulty.  Patient was given 1 L of LR.  On reassessment, family notes that after getting fluids, patient seems 100% back to her normal baseline.  Dehydration and hypokalemia may have been contributing to some of her symptoms which have resolved.  Patient safe for discharge and will follow-up with PCP.  She was given strict return precautions.  The patient is on the cardiac monitor to evaluate for evidence of arrhythmia and/or significant heart rate changes. Clinical Course as of 02/21/23 1829  Sat Feb 21, 2023  1542 Comprehensive metabolic panel(!) Mild hyponatremia and hypokalemia but otherwise unremarkable [DW]  1543 CBC with Differential Unremarkable [DW]  1701 Independently interpreted CT head and CT C-spine with no acute pathology [DW]  1816 Urinalysis, Routine w reflex microscopic -Urine, Clean Catch(!) No evidence of UTI [DW]  1828 Patient feeling significantly better after fluid rehydration.  Family notes she seems at her normal baseline at this time.  Will discharge [DW]    Clinical Course User Index [DW] Janith Lima, MD     FINAL CLINICAL IMPRESSION(S) / ED DIAGNOSES   Final diagnoses:  Dehydration  Confusion     Rx / DC Orders   ED Discharge Orders     None        Note:  This document was prepared using Dragon  voice recognition software and may include unintentional dictation errors.   Janith Lima, MD 02/21/23 [phone]
